# Patient Record
Sex: Male | Born: 1950 | Race: White | Hispanic: No | Marital: Married | Smoking: Current some day smoker
Health system: Southern US, Community
[De-identification: ages and names within clinical notes are randomized; demographics above are authoritative.]

## PROBLEM LIST (undated history)

## (undated) HISTORY — PX: CARDIAC VALVE REPLACEMENT: SHX585

## (undated) HISTORY — PX: FRONTAL SINUS OBLITERATION: SHX1685

## (undated) HISTORY — PX: APPENDECTOMY: SHX54

---

## 1977-03-19 HISTORY — PX: RADICAL NECK DISSECTION: SHX2284

## 1998-08-01 ENCOUNTER — Ambulatory Visit (HOSPITAL_BASED_OUTPATIENT_CLINIC_OR_DEPARTMENT_OTHER): Admission: RE | Admit: 1998-08-01 | Discharge: 1998-08-01 | Payer: Self-pay | Admitting: Diagnostic Radiology

## 1998-10-10 ENCOUNTER — Ambulatory Visit (HOSPITAL_BASED_OUTPATIENT_CLINIC_OR_DEPARTMENT_OTHER): Admission: RE | Admit: 1998-10-10 | Discharge: 1998-10-10 | Payer: Self-pay | Admitting: *Deleted

## 2003-12-13 ENCOUNTER — Ambulatory Visit (HOSPITAL_COMMUNITY): Admission: RE | Admit: 2003-12-13 | Discharge: 2003-12-13 | Payer: Self-pay | Admitting: Neurosurgery

## 2004-04-28 ENCOUNTER — Encounter: Admission: RE | Admit: 2004-04-28 | Discharge: 2004-04-28 | Payer: Self-pay | Admitting: General Surgery
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
## 2006-02-26 ENCOUNTER — Encounter: Admission: RE | Admit: 2006-02-26 | Discharge: 2006-02-26 | Payer: Self-pay | Admitting: Otolaryngology

## 2006-03-06 ENCOUNTER — Ambulatory Visit (HOSPITAL_COMMUNITY): Admission: RE | Admit: 2006-03-06 | Discharge: 2006-03-07 | Payer: Self-pay | Admitting: Otolaryngology

## 2006-03-06 ENCOUNTER — Encounter (INDEPENDENT_AMBULATORY_CARE_PROVIDER_SITE_OTHER): Payer: Self-pay | Admitting: Specialist

## 2007-11-23 IMAGING — CR DG CHEST 2V
2 series · 2 of 2 positions shown · non-contrast
Comparison: None.

CLINICAL DATA: Preadmission for sinus surgery.  Smoker, 39 pack year.  No chest complaints.  Hypertension.  
CHEST ? 2 VIEW ? 03/05/06:

[view not recorded (1 of 2)]
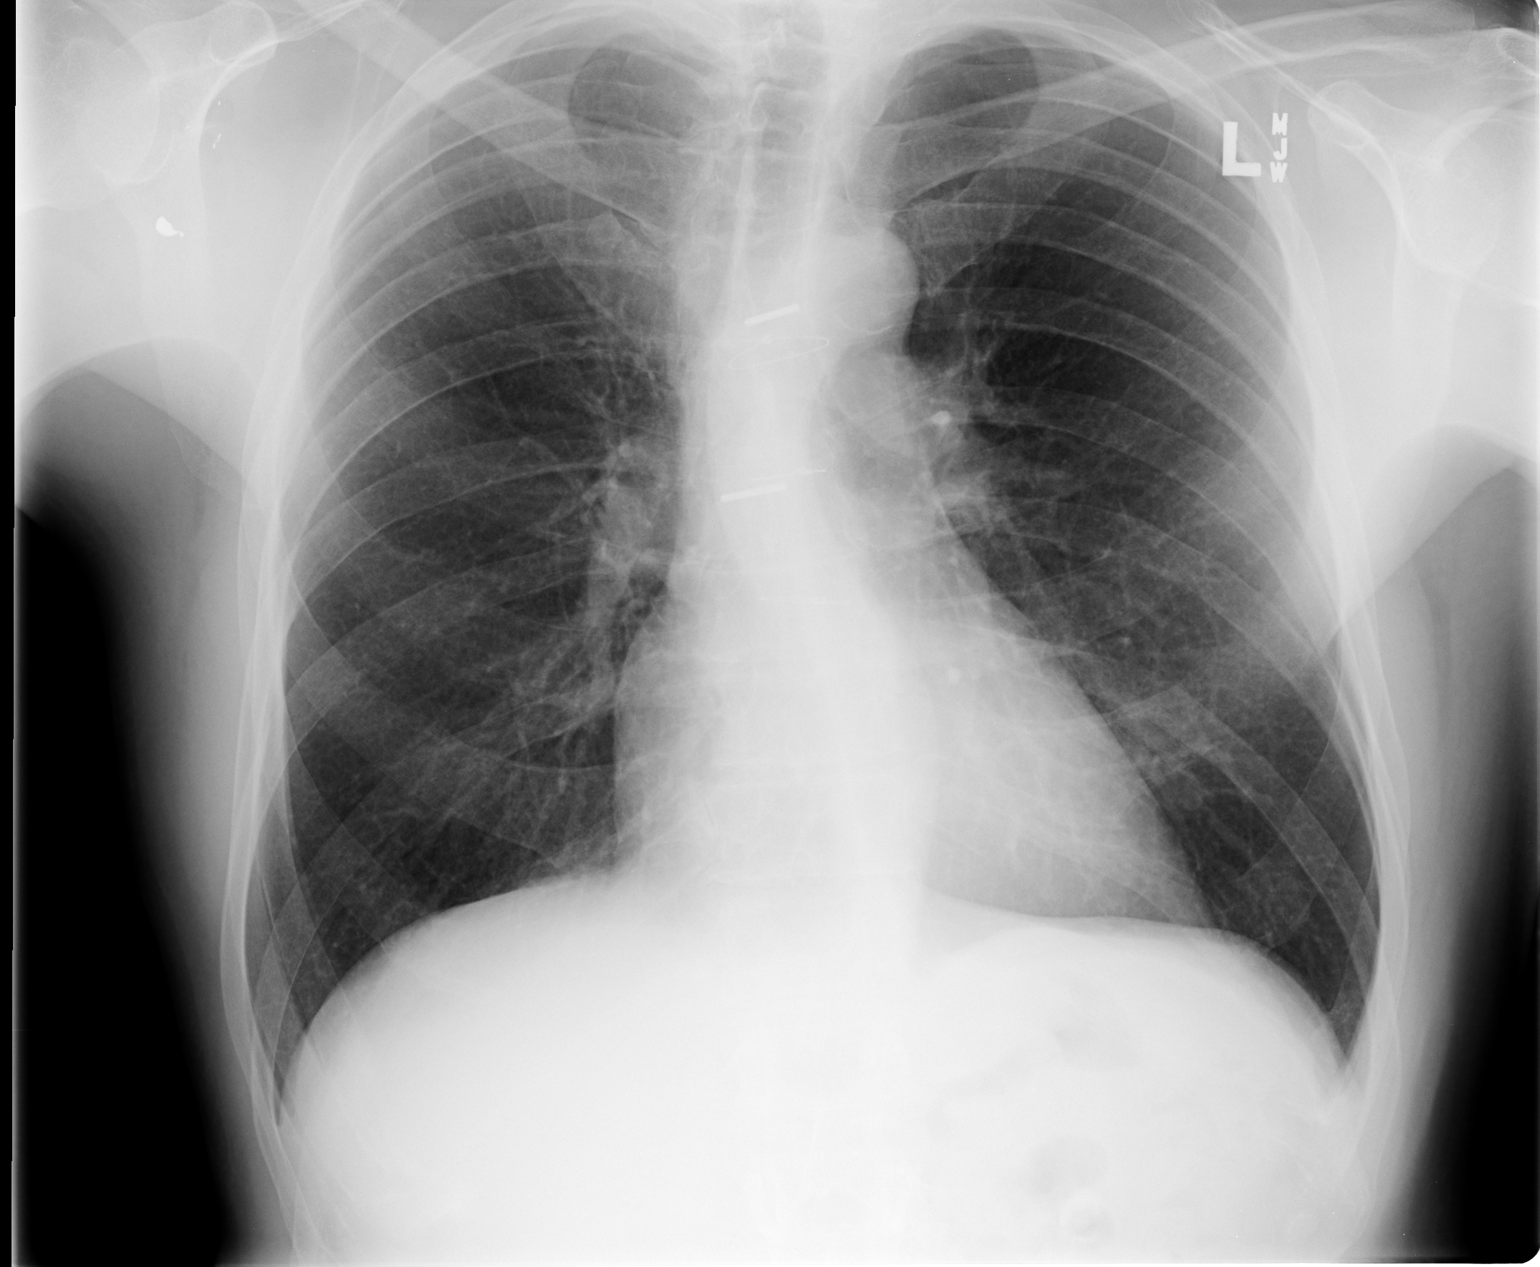

[view not recorded (2 of 2)]
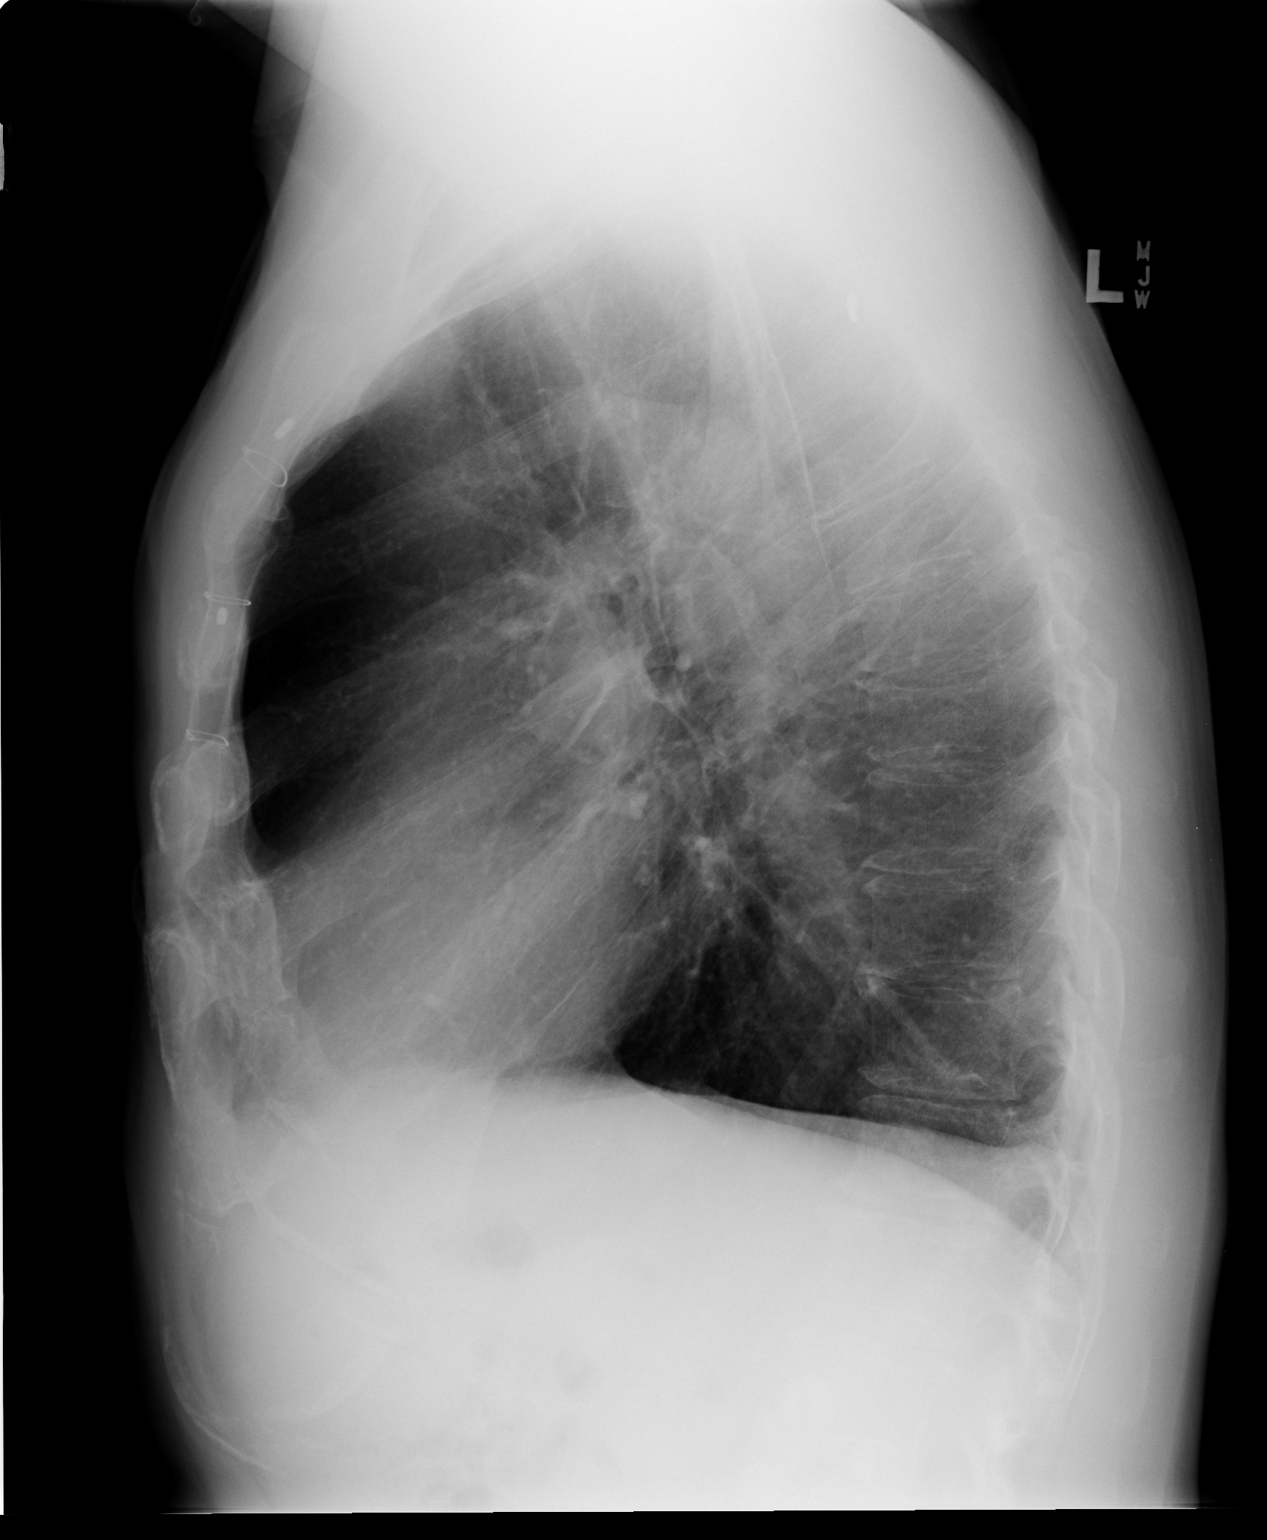

[2 of 2 positions shown; findings below may reference images not displayed]

FINDINGS: Sternal wire sutures noted with generalized prominence of bronchopulmonary markings of slight asthma or bronchitis.  The lungs are otherwise clear.  Slight cardiomegaly is seen with thoracic aortic ectasia and slight calcifications.  Mediastinum, hila, pleura, and osseous structures are unremarkable with small metallic foreign bodies seen at the posterior right axillary or thoracic region.
IMPRESSION: 1.  Slight cardiomegaly.
2.  Slight asthma or probable chronic bronchitis.
3.  Otherwise no active disease.

## 2009-03-24 ENCOUNTER — Encounter (INDEPENDENT_AMBULATORY_CARE_PROVIDER_SITE_OTHER): Payer: Self-pay | Admitting: *Deleted

## 2009-04-04 ENCOUNTER — Telehealth (INDEPENDENT_AMBULATORY_CARE_PROVIDER_SITE_OTHER): Payer: Self-pay | Admitting: *Deleted

## 2009-04-05 ENCOUNTER — Ambulatory Visit: Payer: Self-pay | Admitting: Gastroenterology

## 2009-04-05 DIAGNOSIS — F3289 Other specified depressive episodes: Secondary | ICD-10-CM | POA: Insufficient documentation

## 2009-04-05 DIAGNOSIS — F411 Generalized anxiety disorder: Secondary | ICD-10-CM | POA: Insufficient documentation

## 2009-04-05 DIAGNOSIS — R6881 Early satiety: Secondary | ICD-10-CM | POA: Insufficient documentation

## 2009-04-05 DIAGNOSIS — F329 Major depressive disorder, single episode, unspecified: Secondary | ICD-10-CM

## 2009-04-05 DIAGNOSIS — R1033 Periumbilical pain: Secondary | ICD-10-CM | POA: Insufficient documentation

## 2009-04-05 DIAGNOSIS — R634 Abnormal weight loss: Secondary | ICD-10-CM | POA: Insufficient documentation

## 2009-04-07 ENCOUNTER — Ambulatory Visit: Payer: Self-pay | Admitting: Cardiology

## 2009-04-13 LAB — CONVERTED CEMR LAB
Albumin: 4 g/dL (ref 3.5–5.2)
CO2: 28 meq/L (ref 19–32)
Eosinophils Relative: 1.7 % (ref 0.0–5.0)
GFR calc non Af Amer: 92.53 mL/min (ref >60)
Glucose, Bld: 78 mg/dL (ref 70–99)
Lipase: 19 units/L (ref 11.0–59.0)
Monocytes Relative: 5.2 % (ref 3.0–12.0)
Neutrophils Relative %: 66.6 % (ref 43.0–77.0)
Platelets: 242 10*3/uL (ref 150.0–400.0)
Sodium: 142 meq/L (ref 135–145)
Total Bilirubin: 0.7 mg/dL (ref 0.3–1.2)
Total Protein: 6.6 g/dL (ref 6.0–8.3)
WBC: 7.8 10*3/uL (ref 4.5–10.5)

## 2009-04-19 ENCOUNTER — Ambulatory Visit: Payer: Self-pay | Admitting: Gastroenterology

## 2009-04-19 DIAGNOSIS — I700 Atherosclerosis of aorta: Secondary | ICD-10-CM | POA: Insufficient documentation

## 2009-04-19 DIAGNOSIS — I69959 Hemiplegia and hemiparesis following unspecified cerebrovascular disease affecting unspecified side: Secondary | ICD-10-CM | POA: Insufficient documentation

## 2009-04-19 DIAGNOSIS — Z87898 Personal history of other specified conditions: Secondary | ICD-10-CM

## 2009-04-27 ENCOUNTER — Encounter: Payer: Self-pay | Admitting: Cardiology

## 2009-04-28 ENCOUNTER — Encounter: Admission: RE | Admit: 2009-04-28 | Discharge: 2009-04-28 | Payer: Self-pay | Admitting: Family Medicine

## 2009-05-10 ENCOUNTER — Ambulatory Visit: Payer: Self-pay | Admitting: Cardiology

## 2009-05-10 DIAGNOSIS — I7090 Unspecified atherosclerosis: Secondary | ICD-10-CM

## 2009-05-19 ENCOUNTER — Ambulatory Visit: Payer: Self-pay | Admitting: Cardiology

## 2009-05-25 ENCOUNTER — Telehealth: Payer: Self-pay | Admitting: Cardiology

## 2009-05-25 LAB — CONVERTED CEMR LAB
CO2: 28 meq/L (ref 19–32)
Calcium: 9.4 mg/dL (ref 8.4–10.5)
Chloride: 104 meq/L (ref 96–112)
Sodium: 139 meq/L (ref 135–145)

## 2009-05-26 ENCOUNTER — Encounter: Payer: Self-pay | Admitting: Cardiology

## 2009-05-26 ENCOUNTER — Ambulatory Visit: Payer: Self-pay | Admitting: Cardiovascular Disease

## 2009-05-26 ENCOUNTER — Ambulatory Visit: Payer: Self-pay

## 2009-05-26 ENCOUNTER — Ambulatory Visit (HOSPITAL_COMMUNITY): Admission: RE | Admit: 2009-05-26 | Discharge: 2009-05-26 | Payer: Self-pay | Admitting: Cardiology

## 2009-06-06 DIAGNOSIS — N200 Calculus of kidney: Secondary | ICD-10-CM | POA: Insufficient documentation

## 2009-06-06 DIAGNOSIS — E785 Hyperlipidemia, unspecified: Secondary | ICD-10-CM

## 2009-06-06 DIAGNOSIS — I10 Essential (primary) hypertension: Secondary | ICD-10-CM | POA: Insufficient documentation

## 2009-06-07 ENCOUNTER — Ambulatory Visit: Payer: Self-pay | Admitting: Cardiology

## 2009-06-07 DIAGNOSIS — I379 Nonrheumatic pulmonary valve disorder, unspecified: Secondary | ICD-10-CM | POA: Insufficient documentation

## 2009-06-21 ENCOUNTER — Encounter (INDEPENDENT_AMBULATORY_CARE_PROVIDER_SITE_OTHER): Payer: Self-pay | Admitting: *Deleted

## 2009-07-04 ENCOUNTER — Telehealth: Payer: Self-pay | Admitting: Cardiology

## 2009-07-04 ENCOUNTER — Encounter: Payer: Self-pay | Admitting: Cardiology

## 2009-07-12 ENCOUNTER — Encounter: Payer: Self-pay | Admitting: Gastroenterology

## 2009-07-12 ENCOUNTER — Ambulatory Visit: Payer: Self-pay | Admitting: Cardiology

## 2009-07-12 ENCOUNTER — Ambulatory Visit (HOSPITAL_COMMUNITY): Admission: RE | Admit: 2009-07-12 | Discharge: 2009-07-12 | Payer: Self-pay | Admitting: Cardiology

## 2009-07-26 ENCOUNTER — Encounter (INDEPENDENT_AMBULATORY_CARE_PROVIDER_SITE_OTHER): Payer: Self-pay | Admitting: *Deleted

## 2010-04-09 ENCOUNTER — Encounter: Payer: Self-pay | Admitting: Cardiology

## 2010-04-16 LAB — CONVERTED CEMR LAB
AST: 20 units/L (ref 0–37)
Albumin: 3.6 g/dL (ref 3.5–5.2)
CO2: 30 meq/L (ref 19–32)
Chloride: 108 meq/L (ref 96–112)
HDL: 49.1 mg/dL (ref >39.00)
LDL Cholesterol: 111 mg/dL — ABNORMAL HIGH (ref 0–99)
Sodium: 143 meq/L (ref 135–145)
Total CHOL/HDL Ratio: 4
Triglycerides: 109 mg/dL (ref 0.0–149.0)

## 2010-04-20 NOTE — Progress Notes (Signed)
Summary: Sooner Appt.  Phone Note Call from Patient Call back at [phone]   Caller: Patient Call For: Dr. Juanda Chance Reason for Call: Talk to Nurse Summary of Call: Pt. said he has an appt. on 05-17-09 and is losing alot of weight and has no appetite. Wants to know if he can be worked in sooner Initial call taken by: Karna Christmas,  April 04, 2009 1:39 PM  Follow-up for Phone Call        Pt. c/o abd.pain,early satiety. Has lost about 20 pounds in less than 2 monthes. Will see any GI MD, no GI history.  Pt. will see Mike Gip [facility] on 04-05-09 at 10:30am. Follow-up by: Laureen Ochs LPN,  April 04, 2009 2:26 PM
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
## 2010-04-20 NOTE — Progress Notes (Signed)
Summary: Problems with chlorthalidone  Phone Note Outgoing Call   Call placed by: Bernita Raisin, RN, BSN,  May 25, 2009 3:44 PM Call placed to: Patient Summary of Call: Pt has an Echo scheduled for tomorrow on 05-26-09. Pt states he is not able to tolerate chlorthalidone; "it is wearing him out". Pt uses a wheelchair & has difficulty going to the bathroom so often and not able to get much rest. RN confirmed dose 25 Mg in the morning. RN will forward to Dr Shirlee Latch for his review.     Appended Document: Problems with chlorthalidone d/c chlorthalidone, start norvasc 5 mg daily.   Appended Document: Problems with chlorthalidone Spoke with pt to let him know Dr. Shirlee Latch said it was OK to stop the chlorthalidone but he needs to start Norvasc 5 mg daily.  [facility] [address] [facility] [place].  Pt verbalized understanding of this change.

## 2010-04-20 NOTE — Letter (Signed)
Summary: New Patient letter  [facility]  [address] [place], [place] [address]   Phone: [phone]  Fax: [phone]       03/24/2009 MRN: [id number]  [name] [address] [place], [place]  [address]  Dear Mr. SHERK,  Welcome to the Gastroenterology Division at  [facility].    You are scheduled to see Dr.  Juanda Chance on May 17, 2009 at 2:45pm on the 3rd floor at [facility], [address]  We ask that you try to arrive at our office 15 minutes prior to your appointment time to allow for check-in.  We would like you to complete the enclosed self-administered evaluation form prior to your visit and bring it with you on the day of your appointment.  We will review it with you.  Also, please bring a complete list of all your medications or, if you prefer, bring the medication bottles and we will list them.  Please bring your insurance card so that we may make a copy of it.  If your insurance requires a referral to see a specialist, please bring your referral form from your primary care physician.  Co-payments are due at the time of your visit and may be paid by cash, check or credit card.     Your office visit will consist of a consult with your physician (includes a physical exam), any laboratory testing he/she may order, scheduling of any necessary diagnostic testing (e.g. x-ray, ultrasound, CT-scan), and scheduling of a procedure (e.g. Endoscopy, Colonoscopy) if required.  Please allow enough time on your schedule to allow for any/all of these possibilities.    If you cannot keep your appointment, please call [phone] to cancel or reschedule prior to your appointment date.  This allows [place] the opportunity to schedule an appointment for another patient in need of care.  If you do not cancel or reschedule by 5 p.m. the business day prior to your appointment date, you will be charged a $50.00 late cancellation/no-show fee.    Thank you for choosing  [facility] for your medical needs.  We appreciate the opportunity to care for you.  Please visit [place] at our website  to learn more about our practice.                     Sincerely,                                                             The Gastroenterology Division

## 2010-04-20 NOTE — Letter (Signed)
Summary: TEE Instructions  [facility], Main Office  [address]   [place], [place] [address]   Phone: [phone]  Fax: [phone]      TEE Instructions    You are scheduled for a TEE on  Tuesday April 26,2011  with Dr. Shirlee Latch.  Please arrive at the [facility] at 9:30 am  on the day of your procedure.  1)   Diet:      Nothing to eat or drink after midnight except your medications with a sip of water.     2)  Must have a responsible person to drive you home.  3)   Bring your current insurance cards and current list of all your medications.   *Special Note:  Every effort is made to have your procedure done on time.  Occasionally there are emergencies that present themselves at the hospital that may cause delays.  Please be patient if a delay does occur.  *If you have any questions after you get home, please call the office at [phone].    Appended Document: TEE Instructions    Clinical Lists Changes  Orders: Added new Referral order of Trans Esophageal Echocardiogram (TEE) - Signed

## 2010-04-20 NOTE — Progress Notes (Signed)
Summary: Schedule TEE  Phone Note Outgoing Call   Call placed by: Katina Dung, RN, BSN,  July 04, 2009 2:16 PM Call placed to: Patient Summary of Call: schedule TEE--unable to have Cardiac MRI/MRA  Follow-up for Phone Call        schedule TEE per Dr Vito Berger with patient --TEE scheduled for 07-12-09 at 10:30 arrive 9:30--booking #[id number]--instructions mailed to pt

## 2010-04-20 NOTE — Letter (Signed)
Summary: Office Visit Letter  [facility]  [address] [place], [place] [address]   Phone: [phone]  Fax: [phone]      Jul 26, 2009 MRN: [id number]   Henry Patel [address] [place], [place]  [address]   Dear Mr. BHAT,   According to our records, it is time for you to schedule a follow-up office visit with [place].   At your convenience, please call [phone] (option #2)to schedule an office visit. If you have any questions, concerns, or feel that this letter is in error, we would appreciate your call.   Sincerely,    Vania Rea. Jarold Motto, M.D.  [facility] [phone]

## 2010-04-20 NOTE — Letter (Signed)
Summary: Appointment - Cardiac MRI  [facility], Main Office  [address]   [place], [place] [address]   Phone: [phone]  Fax: [phone]      June 21, 2009 MRN: [id number]   Henry Patel [address] [place], [place]  [address]   Dear Mr. STIFF,   We have scheduled the above patient for an appointment for a Cardiac MRI on 07/04/2009 at 8:00am.  Please refer to the below information for the location and instructions for this test:  Location:     [facility]       [address]       [place], [place]  [address] Instructions:    Arrive at [facility] Outpatient Registration 45 minutes prior to your appointment time.  This will ensure you are in the Radiology Department 30 minutes prior to your appointment.    There are no restrictions for this test you may eat and take medications as usual.  If you need to reschedule this appointment please call at the number listed above.  Sincerely,  Glass blower/designer
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
## 2010-04-20 NOTE — Letter (Signed)
Summary: [facility] Office Note  [facility] Office Note   Imported By: Roderic Ovens 06/20/2009 13:39:33  _____________________________________________________________________  External Attachment:    Type:   Image     Comment:   External Document

## 2010-04-20 NOTE — Miscellaneous (Signed)
Summary: Refill for Aciphex   Clinical Lists Changes  Medications: Rx of ACIPHEX 20 MG TBEC (RABEPRAZOLE SODIUM) Take 1 tab 30 min prior to breakfast;  #90 x 3;  Signed;  Entered by: Ok Anis CMA;  Authorized by: Mardella Layman MD [facility];  Method used: Electronically [facility]  [address] [id number] [address] [place], [place], [place]  [address], Ph: [phone] or [phone], Fax: [phone]    Prescriptions: ACIPHEX 20 MG TBEC (RABEPRAZOLE SODIUM) Take 1 tab 30 min prior to breakfast  #90 x 3   Entered by:   Ok Anis CMA   Authorized by:   Mardella Layman MD [facility]   Signed by:   Ok Anis CMA on 07/12/2009   Method used:   [facility]ically to        [facility]  [address] [id number] ([facility]ail[facility]       [address]       [place], [place]  [address]       Ph: [phone] or [phone]       Fax: [phone]   RxID:   [id number]

## 2010-04-20 NOTE — Miscellaneous (Signed)
Summary: Change in medications  Clinical Lists Changes  Medications: Removed medication of CHLORTHALIDONE 25 MG TABS (CHLORTHALIDONE) one tablet daily Added new medication of NORVASC 5 MG TABS (AMLODIPINE BESYLATE) take one tablet by mouth once daily - Signed Rx of NORVASC 5 MG TABS (AMLODIPINE BESYLATE) take one tablet by mouth once daily;  #90 x 4;  Signed;  Entered by: Minerva Areola, RN, BSN;  Authorized by: Marca Ancona, MD;  Method used: [facility]  [id number] [address] [place], [place], [place]  [address], Ph: [phone] or [phone], Fax: [phone]    Prescriptions: NORVASC 5 MG TABS (AMLODIPINE BESYLATE) take one tablet by mouth once daily  #90 x 4   Entered by:   Minerva Areola, RN, BSN   Authorized by:   Marca Ancona, MD   Signed by:   Minerva Areola, RN, BSN on 05/26/2009   Method used:   [facility]        [facility]  [address] [id number]       [address]       [place], [place]  [address]       Ph: [phone] or [phone]       Fax: [phone]   RxID:   [id number]

## 2010-08-04 NOTE — Op Note (Signed)
NAME:  Henry Patel, Henry Patel NO.:  [id number]   MEDICAL RECORD NO.:  [id number]          PATIENT TYPE:  OIB   LOCATION:  2550                         FACILITY:  [facility]   PHYSICIAN:  Hermelinda Medicus, M.D.   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   POSTOP DIAGNOSIS:  Bilateral frontal unresolving sinusitis with nasal  frontal duct obstruction.   OPERATION:  Osteoplastic frontal obliteration.   SURGEON:  Hermelinda Medicus, M.D.   ANESTHESIA:  Was general endotracheal with Quita Skye. Krista Blue, M.D.   The patient was aware of the risks and gains. He was aware of the risk  of a possible meningitis, the risk of possible swelling and infection  near his superior orbital region, the risk of chronic further sinusitis,  risk of pain, knowing that the front face of the sinus needed to be  removed, all the mucous membrane was being removed from the sinus, and  this was being obliterated. Our anesthesiologist also was Dr. Randa Evens.  The mucous membrane would be removed from the sinus and the sinus would  be totally obliterated with Gelfoam.   DESCRIPTION OF PROCEDURE:  The patient was placed in supine position  under general endotracheal anesthesia with Dr. Randa Evens, the patient was  prepped and draped with Betadine and then the x-rays were in the room,  the CAT scans were in the room, as a plan for our removal of the front  face of this frontal sinus.  An incision was made over across the nasion  area and then the superior aspect of brow and the skin was mobilized and  elevated but not damaging the periosteum. The periosteum was then  guarded very carefully and kept in contact with the area where the bony  plate would be elevated.  An incision was made on the nasion where the  bony cut would need  to be made and then our bony cuts in the superior  aspect of the frontal sinus were established by using the small mastoid  type bit to cause a small indentation and marked this out. This was all  calculated from the x-rays and from a CAT scan that were previously  planned. Once this was done then the rotating saw was then used to make  cuts in the front face of the frontal sinus and this was done superiorly  middle and then worked laterally. This oscillating saw was with a angle  in toward the medial portion of the sinus so that we had no risk of  breaking through the back wall of the sinus or the back wall of the  skull and hitting the dura. Once this was cut across and then laterally  and then the nasion or glabella region of bone was cut. The sinus front  face was elevated on a flat type orientation and elevated to the point  essentially like opening a door. The sinus was found to be filled with  debris, purulent matter, thickened membrane.  This was cultured for  aerobic, anaerobic noting that he had been on Levaquin and clindamycin  and also all this membrane was removed.  We looked down into the nasal  frontal duct region which was very scarred and obstructed and removed  some of this membrane down to the point where we felt the nose where it  was meeting up with the nose and the ethmoid sinus.  All this was  carefully removed using the drill and the curettes using the small sinus  curettes and the otologic curettes for every bit of mucous membrane to  be removed and then even the mastoid type drill was used. The back plate  of the sinus was also cleansed of any mucous membrane and once all  mucous membrane was removed then we placed Gelfoam with Ciprodex drops  and this was placed carefully within the sinus, packing it tightly, and  then this frontal bony flap was brought back into position.  The  periosteum was sutured back into position using 2-0 chromic catgut.  The   subcutaneous tissues were also closed with 2-0 chromic catgut and then  the skin was closed with 5-0 Ethilon.  A mild pressure dressing was  placed over the frontal region.  The patient was awakened, tolerated  procedure well and is doing well postop.   His follow-up will be in a 23-hour observation on 5700. He will be  carefully observed and then his follow-up will be in 4 days, then 10  days, 3 weeks, 5 weeks, 7 weeks, and then 3 months, and 6 months, and 6-  month intervals for 2 years.           ______________________________  Hermelinda Medicus, M.D.     JC/MEDQ  D:  03/06/2006  T:  03/06/2006  Job:  [id number]   cc:   Georganna Skeans, M.D.  Kristine Garbe. Ezzard Standing, M.D.  Payton Doughty, M.D.
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
## 2012-09-17 ENCOUNTER — Other Ambulatory Visit: Payer: Self-pay | Admitting: Neurosurgery

## 2012-09-17 DIAGNOSIS — M542 Cervicalgia: Secondary | ICD-10-CM

## 2012-09-18 ENCOUNTER — Other Ambulatory Visit: Payer: Self-pay

## 2012-09-23 ENCOUNTER — Ambulatory Visit
Admission: RE | Admit: 2012-09-23 | Discharge: 2012-09-23 | Disposition: A | Discharge status: Home / Self Care | Payer: Medicare Other | Source: Ambulatory Visit | Attending: Neurosurgery | Admitting: Neurosurgery

## 2012-09-23 DIAGNOSIS — M542 Cervicalgia: Secondary | ICD-10-CM

## 2012-09-23 MED ORDER — IOHEXOL 300 MG/ML  SOLN
100.0000 mL | Freq: Once | INTRAMUSCULAR | Status: AC | PRN
  Administered 2012-09-23: 100 mL via INTRAVENOUS

## 2013-06-02 ENCOUNTER — Encounter (INDEPENDENT_AMBULATORY_CARE_PROVIDER_SITE_OTHER): Payer: Self-pay | Admitting: Surgery

## 2013-06-02 ENCOUNTER — Ambulatory Visit (INDEPENDENT_AMBULATORY_CARE_PROVIDER_SITE_OTHER): Payer: BC Managed Care – PPO | Admitting: Surgery

## 2013-06-02 VITALS — BP 140/90 | HR 72 | Temp 99.6°F | Resp 12 | Ht 67.0 in | Wt 155.0 lb

## 2013-06-02 DIAGNOSIS — K432 Incisional hernia without obstruction or gangrene: Secondary | ICD-10-CM | POA: Insufficient documentation

## 2013-06-02 NOTE — Patient Instructions (Signed)
Ventral Hernia A ventral hernia (also called an incisional hernia) is a hernia that occurs at the site of a previous surgical cut (incision) in the abdomen. The abdominal wall spans from your lower chest down to your pelvis. If the abdominal wall is weakened from a surgical incision, a hernia can occur. A hernia is a bulge of bowel or muscle tissue pushing out on the weakened part of the abdominal wall. Ventral hernias can get bigger from straining or lifting. Obese and older people are at higher risk for a ventral hernia. People who develop infections after surgery or require repeat incisions at the same site on the abdomen are also at increased risk. CAUSES  A ventral hernia occurs because of weakness in the abdominal wall at an incision site.  SYMPTOMS  Common symptoms include:  A visible bulge or lump on the abdominal wall.  Pain or tenderness around the lump.  Increased discomfort if you cough or make a sudden movement. If the hernia has blocked part of the intestine, a serious complication can occur (incarcerated or strangulated hernia). This can become a problem that requires emergency surgery because the blood flow to the blocked intestine may be cut off. Symptoms may include:  Feeling sick to your stomach (nauseous).  Throwing up (vomiting).  Stomach swelling (distention) or bloating.  Fever.  Rapid heartbeat. DIAGNOSIS  Your caregiver will take a medical history and perform a physical exam. Various tests may be ordered, such as:  Blood tests.  Urine tests.  Ultrasonography.  X-rays.  Computed tomography (CT). TREATMENT  Watchful waiting may be all that is needed for a smaller hernia that does not cause symptoms. Your caregiver may recommend the use of a supportive belt (truss) that helps to keep the abdominal wall intact. For larger hernias or those that cause pain, surgery to repair the hernia is usually recommended. If a hernia becomes strangulated, emergency surgery  needs to be done right away. HOME CARE INSTRUCTIONS  Avoid putting pressure or strain on the abdominal area.  Avoid heavy lifting.  Use good body positioning for physical tasks. Ask your caregiver about proper body positioning.  Use a supportive belt as directed by your caregiver.  Maintain a healthy weight.  Eat foods that are high in fiber, such as whole grains, fruits, and vegetables. Fiber helps prevent difficult bowel movements (constipation).  Drink enough fluids to keep your urine clear or pale yellow.  Follow up with your caregiver as directed. SEEK MEDICAL CARE IF:   Your hernia seems to be getting larger or more painful. SEEK IMMEDIATE MEDICAL CARE IF:   You have abdominal pain that is sudden and sharp.  Your pain becomes severe.  You have repeated vomiting.  You are sweating a lot.  You notice a rapid heartbeat.  You develop a fever. MAKE SURE YOU:   Understand these instructions.  Will watch your condition.  Will get help right away if you are not doing well or get worse. Document Released: 02/20/2012 Document Reviewed: 02/20/2012 [employer] Patient Information 2014 [employer].  
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
## 2014-02-16 ENCOUNTER — Emergency Department (HOSPITAL_COMMUNITY): Payer: BC Managed Care – PPO

## 2014-02-16 ENCOUNTER — Emergency Department (HOSPITAL_COMMUNITY)
Admission: EM | Admit: 2014-02-16 | Discharge: 2014-02-16 | Disposition: A | Discharge status: Home / Self Care | Payer: BC Managed Care – PPO | Attending: Emergency Medicine | Admitting: Emergency Medicine

## 2014-02-16 ENCOUNTER — Encounter (HOSPITAL_COMMUNITY): Payer: Self-pay | Admitting: *Deleted

## 2014-02-16 DIAGNOSIS — Y9389 Activity, other specified: Secondary | ICD-10-CM | POA: Insufficient documentation

## 2014-02-16 DIAGNOSIS — W01198A Fall on same level from slipping, tripping and stumbling with subsequent striking against other object, initial encounter: Secondary | ICD-10-CM | POA: Diagnosis not present

## 2014-02-16 DIAGNOSIS — Z79899 Other long term (current) drug therapy: Secondary | ICD-10-CM | POA: Insufficient documentation

## 2014-02-16 DIAGNOSIS — S8992XA Unspecified injury of left lower leg, initial encounter: Secondary | ICD-10-CM | POA: Insufficient documentation

## 2014-02-16 DIAGNOSIS — Y998 Other external cause status: Secondary | ICD-10-CM | POA: Insufficient documentation

## 2014-02-16 DIAGNOSIS — R52 Pain, unspecified: Secondary | ICD-10-CM

## 2014-02-16 DIAGNOSIS — Y9289 Other specified places as the place of occurrence of the external cause: Secondary | ICD-10-CM | POA: Insufficient documentation

## 2014-02-16 DIAGNOSIS — Z880 Allergy status to penicillin: Secondary | ICD-10-CM | POA: Insufficient documentation

## 2014-02-16 DIAGNOSIS — Z720 Tobacco use: Secondary | ICD-10-CM | POA: Insufficient documentation

## 2014-02-16 LAB — I-STAT CHEM 8, ED
BUN: 12 mg/dL (ref 6–23)
CALCIUM ION: 1.31 mmol/L — AB (ref 1.13–1.30)
CREATININE: 0.9 mg/dL (ref 0.50–1.35)
Chloride: 104 mEq/L (ref 96–112)
Glucose, Bld: 84 mg/dL (ref 70–99)
HCT: 44 % (ref 39.0–52.0)
HEMOGLOBIN: 15 g/dL (ref 13.0–17.0)
Potassium: 4.3 mEq/L (ref 3.7–5.3)
Sodium: 140 mEq/L (ref 137–147)
TCO2: 21 mmol/L (ref 0–100)

## 2014-02-16 MED ORDER — METHOCARBAMOL 500 MG PO TABS: 500.0000 mg | ORAL_TABLET | Freq: Two times a day (BID) | ORAL | Status: AC

## 2014-02-16 NOTE — Discharge Instructions (Signed)
Take robaxin with your home pain medication. Refer to attached documents for more information. Follow up with your doctor as needed.

## 2014-02-16 NOTE — ED Notes (Signed)
Larey SeatFell this past Saturday. C/o lt. Knee and swelling. No deformity, no bruising.
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
## 2014-02-16 NOTE — ED Notes (Signed)
PT 'S FAMILY NOW REPORTS TO PA THAT HIS PCP CALLED YESTERDAY SAYING PT'S POTASSIUM WAS 'HIGH' AND NEEDED IT RECHECKED.

## 2014-02-16 NOTE — ED Notes (Signed)
Pt unable to walk/get into his house, [facility] called.

## 2014-04-01 DIAGNOSIS — G8190 Hemiplegia, unspecified affecting unspecified side: Secondary | ICD-10-CM | POA: Diagnosis not present

## 2014-06-02 DIAGNOSIS — J0100 Acute maxillary sinusitis, unspecified: Secondary | ICD-10-CM | POA: Diagnosis not present

## 2014-07-06 DIAGNOSIS — S34109A Unspecified injury to unspecified level of lumbar spinal cord, initial encounter: Secondary | ICD-10-CM | POA: Diagnosis not present

## 2014-07-06 DIAGNOSIS — G5730 Lesion of lateral popliteal nerve, unspecified lower limb: Secondary | ICD-10-CM | POA: Diagnosis not present

## 2014-07-06 DIAGNOSIS — S14109A Unspecified injury at unspecified level of cervical spinal cord, initial encounter: Secondary | ICD-10-CM | POA: Diagnosis not present

## 2014-07-06 DIAGNOSIS — J329 Chronic sinusitis, unspecified: Secondary | ICD-10-CM | POA: Diagnosis not present

## 2014-07-12 DIAGNOSIS — G5730 Lesion of lateral popliteal nerve, unspecified lower limb: Secondary | ICD-10-CM | POA: Diagnosis not present

## 2014-10-06 DIAGNOSIS — G5730 Lesion of lateral popliteal nerve, unspecified lower limb: Secondary | ICD-10-CM | POA: Diagnosis not present

## 2014-10-06 DIAGNOSIS — S34109A Unspecified injury to unspecified level of lumbar spinal cord, initial encounter: Secondary | ICD-10-CM | POA: Diagnosis not present

## 2014-10-06 DIAGNOSIS — S14109A Unspecified injury at unspecified level of cervical spinal cord, initial encounter: Secondary | ICD-10-CM | POA: Diagnosis not present

## 2015-01-18 DIAGNOSIS — G8190 Hemiplegia, unspecified affecting unspecified side: Secondary | ICD-10-CM | POA: Diagnosis not present

## 2015-01-24 DIAGNOSIS — S34109A Unspecified injury to unspecified level of lumbar spinal cord, initial encounter: Secondary | ICD-10-CM | POA: Diagnosis not present

## 2015-01-24 DIAGNOSIS — S14109A Unspecified injury at unspecified level of cervical spinal cord, initial encounter: Secondary | ICD-10-CM | POA: Diagnosis not present

## 2015-04-26 DIAGNOSIS — S14109A Unspecified injury at unspecified level of cervical spinal cord, initial encounter: Secondary | ICD-10-CM | POA: Diagnosis not present

## 2015-04-26 DIAGNOSIS — S34109A Unspecified injury to unspecified level of lumbar spinal cord, initial encounter: Secondary | ICD-10-CM | POA: Diagnosis not present

## 2015-04-26 DIAGNOSIS — K5909 Other constipation: Secondary | ICD-10-CM | POA: Diagnosis not present

## 2015-06-10 DIAGNOSIS — S34109A Unspecified injury to unspecified level of lumbar spinal cord, initial encounter: Secondary | ICD-10-CM | POA: Diagnosis not present

## 2015-06-10 DIAGNOSIS — S14109A Unspecified injury at unspecified level of cervical spinal cord, initial encounter: Secondary | ICD-10-CM | POA: Diagnosis not present

## 2016-10-08 ENCOUNTER — Ambulatory Visit: Payer: BLUE CROSS/BLUE SHIELD | Attending: Neurosurgery | Admitting: Physical Therapy

## 2016-10-08 DIAGNOSIS — R293 Abnormal posture: Secondary | ICD-10-CM | POA: Diagnosis present

## 2016-10-08 DIAGNOSIS — M6281 Muscle weakness (generalized): Secondary | ICD-10-CM | POA: Diagnosis present

## 2016-10-08 DIAGNOSIS — R2689 Other abnormalities of gait and mobility: Secondary | ICD-10-CM | POA: Diagnosis present
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
## 2016-10-29 ENCOUNTER — Emergency Department (HOSPITAL_COMMUNITY)
Admission: EM | Admit: 2016-10-29 | Discharge: 2016-11-17 | Disposition: E | Payer: BLUE CROSS/BLUE SHIELD | Attending: Emergency Medicine | Admitting: Emergency Medicine

## 2016-10-29 DIAGNOSIS — I469 Cardiac arrest, cause unspecified: Secondary | ICD-10-CM | POA: Insufficient documentation

## 2016-10-29 MED ORDER — EPINEPHRINE PF 1 MG/10ML IJ SOSY
PREFILLED_SYRINGE | INTRAMUSCULAR | Status: AC | PRN
  Administered 2016-10-29: 1 mg via INTRAVENOUS

## 2016-11-17 NOTE — ED Triage Notes (Signed)
Pt To ED via [facility]  Wife reports pt was awake and talking at 17:30 when she checked on him 18:45 pt was pulseless and apneic.  Enroute to ED EMS gave pt Epi 1mg   X's 9 and Narcan 0.4. Pt regained pulses for approx 10 mins then pt went pulseless again.,  Pt on Samuel BoucheLucas on arrival to ED.

## 2016-11-17 NOTE — Code Documentation (Signed)
Pulse check, no pulses, no spontaneous resp.  CPR d'cd per Dr. Criss AlvineGoldston

## 2016-11-17 NOTE — Progress Notes (Signed)
RT NOTE:  RT @ bedside upon pt arrival via [facility]. Pt intubated in route. RT provided manual ventilation throughout CODE. MD announced TOD @ 2000.

## 2016-11-17 NOTE — Code Documentation (Signed)
Pulse check,  No pulses found by palpation or with doppler.  Monitor showing PEA rhythm.  CPR resumed.

## 2016-11-17 NOTE — ED Provider Notes (Signed)
[facility] DEPT Provider Note   CSN: [id number] Arrival date & time: 11-21-2016  1950     History   Chief Complaint Chief Complaint  Patient presents with  . Cardiac Arrest    HPI Henry Patel is a 66 y.o. male. With history of hypertension who presents as a CPR in progress. Reportedly per EMS, wife found patient unresponsive at approximately 1730. Upon EMS arrival, patient was pulseless and apneic. CPR was started, and ROSC was achieved after 10 minutes temporarily. Patient went into PEA arrest enroute to ED, with ongoing CPR. He was given at the 1 mg x 9 doses.   HPI  No past medical history on file.  There are no active problems to display for this patient.   No past surgical history on file.     Home Medications    Prior to Admission medications   Not on File    Family History No family history on file.  Social History Social History  Substance Use Topics  . Smoking status: Not on file  . Smokeless tobacco: Not on file  . Alcohol use Not on file     Allergies   Patient has no allergy information on record.   Review of Systems Review of Systems  Unable to perform ROS: Patient unresponsive  Skin: Negative for color change and rash.     Physical Exam Updated Vital Signs Ht 6' (1.829 m)   Wt 79.4 kg (175 lb)   BMI 23.73 kg/m   Physical Exam  Constitutional: He appears well-developed.  HENT:  Head: Normocephalic and atraumatic.  Eyes: Conjunctivae are normal.  Unequal pupils, Ll fixed and dilated  Neck: Neck supple.  Cardiovascular:  No murmur heard. pulseless  Pulmonary/Chest: Effort normal and breath sounds normal. No respiratory distress.  Abdominal: Soft. There is no tenderness.  Musculoskeletal: He exhibits no edema.  Neurological: GCS eye subscore is 1. GCS verbal subscore is 1. GCS motor subscore is 1.  Skin: Skin is warm.  Psychiatric: He has a normal mood and affect.  Nursing note and vitals reviewed.    ED  Treatments / Results  Labs (all labs ordered are listed, but only abnormal results are displayed) Labs Reviewed - No data to display  EKG  EKG Interpretation None       Radiology No results found.  Procedures Procedures (including critical care time)  Medications Ordered in ED Medications  EPINEPHrine (ADRENALIN) 1 MG/10ML injection (1 mg Intravenous Given 2016-11-21 1954)     Initial Impression / Assessment and Plan / ED Course  I have reviewed the triage vital signs and the nursing notes.  Pertinent labs & imaging results that were available during my care of the patient were reviewed by me and considered in my medical decision making (see chart for details).    Patient arrived with ongoing CPR in progress by EMS. Per EMS, wife reports visit to Urgent care today for kidney infection. Patient had not started any antibiotics. Report of patient being found unresponsive by wife this evening. EMS reported patient apneic and pulseless. After 10 minutes CPR, pulses transiently returned, but PEA arrest. Total time CPR prior to ED arrival 50 minutes, with 9 rounds epi 1 mg given.   CPR was continued upon patient arrival to ED. He was given additional round of epi. He continued to be in PEA arrest. After 3 rounds of CPR, bedside echo was performed that showed no cardiac activity.   Decision was made by myself and bedside team  to call the code. Time of death called at 39.   I met with patient's wife, daughter and son-in-law, in addition to [place], and notified them of patient's passing.   Discussed case with Medical Examiner, Dr. Jeanette Caprice - patient not found to be medical examiner case.   Patient and plan of care discussed with Attending physician, Dr. Regenia Skeeter.    Final Clinical Impressions(s) / ED Diagnoses   Final diagnoses:  Cardiac arrest [facility])    New Prescriptions There are no discharge medications for this patient.    Arnetha Massy, MD 2016/11/07 2249      Sherwood Gambler, MD 11/04/16 [id number]    Sherwood Gambler, MD 11/04/16 [phone]

## 2016-11-17 NOTE — Progress Notes (Signed)
   01/05/2017 2000  Clinical Encounter Type  Visited With Patient and family together;Health care provider  Visit Type Initial;Spiritual support;Death  Referral From Nurse  Consult/Referral To Chaplain  Spiritual Encounters  Spiritual Needs Prayer;Emotional;Grief support  Stress Factors  Patient Stress Factors None identified  Family Stress Factors Exhausted;Family relationships;Health changes;Lack of knowledge;Loss    Chaplain responded to page in ED for post CPR death. Escorted family to consult B and facilitated notification with doctor. Patient is being cleaned up for family to visit. Provided hospitality, ministry of presence, emotional support and prayer.  Keylani Perlstein [employer]

## 2016-11-17 NOTE — ED Notes (Signed)
1 black belt, 1 pair white socks, $39.00 cash and 1 silver dollar given to pt's wife Phil DoppKaren Visscher

## 2016-11-17 NOTE — Code Documentation (Signed)
Pulse check.  Pt remains pulseless and apneic.  CPR resumed

## 2016-11-17 DEATH — deceased

## 2016-11-26 ENCOUNTER — Ambulatory Visit: Payer: BLUE CROSS/BLUE SHIELD | Admitting: Physical Therapy
